# Patient Record
Sex: Female | Born: 1965 | Race: White | Hispanic: No | Marital: Married | Smoking: Never smoker
Health system: Southern US, Community
[De-identification: ages and names within clinical notes are randomized; demographics above are authoritative.]

## PROBLEM LIST (undated history)

## (undated) DIAGNOSIS — G4730 Sleep apnea, unspecified: Secondary | ICD-10-CM

## (undated) DIAGNOSIS — F329 Major depressive disorder, single episode, unspecified: Secondary | ICD-10-CM

## (undated) DIAGNOSIS — J309 Allergic rhinitis, unspecified: Secondary | ICD-10-CM

## (undated) DIAGNOSIS — F32A Depression, unspecified: Secondary | ICD-10-CM

---

## 2005-01-19 ENCOUNTER — Ambulatory Visit: Payer: Self-pay | Admitting: Family Medicine

## 2007-01-09 ENCOUNTER — Ambulatory Visit: Payer: Self-pay | Admitting: Family Medicine

## 2010-08-16 ENCOUNTER — Ambulatory Visit: Payer: Self-pay | Admitting: Family Medicine

## 2011-01-18 ENCOUNTER — Emergency Department: Payer: Self-pay | Admitting: Emergency Medicine

## 2011-08-30 ENCOUNTER — Ambulatory Visit: Payer: Self-pay | Admitting: Family Medicine

## 2012-08-30 ENCOUNTER — Ambulatory Visit: Payer: Self-pay | Admitting: Family Medicine

## 2012-08-31 ENCOUNTER — Ambulatory Visit: Payer: Self-pay | Admitting: Family Medicine

## 2013-09-17 ENCOUNTER — Ambulatory Visit: Payer: Self-pay | Admitting: Family Medicine

## 2014-12-01 ENCOUNTER — Other Ambulatory Visit: Payer: Self-pay | Admitting: Family Medicine

## 2014-12-02 ENCOUNTER — Other Ambulatory Visit: Payer: Self-pay | Admitting: Family Medicine

## 2014-12-09 ENCOUNTER — Other Ambulatory Visit: Payer: Self-pay | Admitting: Family Medicine

## 2014-12-09 DIAGNOSIS — N6082 Other benign mammary dysplasias of left breast: Secondary | ICD-10-CM

## 2014-12-23 ENCOUNTER — Ambulatory Visit
Admission: EM | Admit: 2014-12-23 | Discharge: 2014-12-23 | Disposition: A | Discharge status: Home / Self Care | Payer: BC Managed Care – PPO | Attending: Family Medicine | Admitting: Family Medicine

## 2014-12-23 DIAGNOSIS — T7840XA Allergy, unspecified, initial encounter: Secondary | ICD-10-CM

## 2014-12-23 DIAGNOSIS — Z889 Allergy status to unspecified drugs, medicaments and biological substances status: Secondary | ICD-10-CM

## 2014-12-23 DIAGNOSIS — R220 Localized swelling, mass and lump, head: Secondary | ICD-10-CM | POA: Diagnosis not present

## 2014-12-23 HISTORY — DX: Allergic rhinitis, unspecified: J30.9

## 2014-12-23 MED ORDER — EPINEPHRINE HCL 1 MG/ML IJ SOLN
0.3000 mg | Freq: Once | INTRAMUSCULAR | Status: AC
  Administered 2014-12-23: 0.3 mg via SUBCUTANEOUS

## 2014-12-23 MED ORDER — PREDNISONE 50 MG PO TABS
60.0000 mg | ORAL_TABLET | Freq: Once | ORAL | Status: AC
  Administered 2014-12-23: 60 mg via ORAL

## 2014-12-23 MED ORDER — FAMOTIDINE 20 MG PO TABS
20.0000 mg | ORAL_TABLET | Freq: Once | ORAL | Status: AC
  Administered 2014-12-23: 20 mg via ORAL

## 2014-12-23 MED ORDER — PREDNISONE 20 MG PO TABS: ORAL_TABLET | ORAL | Status: AC

## 2014-12-23 NOTE — ED Provider Notes (Signed)
CSN: [id number]     Arrival date & time 12/23/14  1910 History   First MD Initiated Contact with Patient 12/23/14 1939     Chief Complaint  Patient presents with  . Allergic Reaction   (Consider location/radiation/quality/duration/timing/severity/associated sxs/prior Treatment) HPI   Is a 49 year old female who presents with a swollen left eye. The patient has had this in the past after receiving allergy shots but this seems to be much worse than before. She states that she felt that it started to swell around 6 PM to 25 mg Benadryl's. Since that time it has decided somewhat but continues to be swollen nearly shut. Not having any foreign body sensation. He is having no respiratory distress.  Past Medical History  Diagnosis Date  . Allergic rhinitis    History reviewed. No pertinent past surgical history. No family history on file. Social History  Substance Use Topics  . Smoking status: Never Smoker   . Smokeless tobacco: None  . Alcohol Use: 0.0 oz/week    0 Standard drinks or equivalent per week     Comment: occasional   OB History    No data available     Review of Systems  Constitutional: Negative for fever, chills, activity change and fatigue.  HENT: Positive for facial swelling.   All other systems reviewed and are negative.   Allergies  Sulfa antibiotics  Home Medications   Prior to Admission medications   Medication Sig Start Date End Date Taking? Authorizing Provider  azelastine (OPTIVAR) 0.05 % ophthalmic solution 1 drop 2 (two) times daily.   Yes Historical Provider, MD  cetirizine (ZYRTEC) 10 MG tablet Take 10 mg by mouth daily.   Yes Historical Provider, MD  cholecalciferol (VITAMIN D) 1000 UNITS tablet Take 1,000 Units by mouth daily.   Yes Historical Provider, MD  citalopram (CELEXA) 20 MG tablet Take 20 mg by mouth daily.   Yes Historical Provider, MD  diphenhydrAMINE (SOMINEX) 25 MG tablet Take 25 mg by mouth at bedtime as needed for sleep.   Yes  Historical Provider, MD  fenofibrate 160 MG tablet Take 160 mg by mouth daily.   Yes Historical Provider, MD  fluticasone (FLONASE) 50 MCG/ACT nasal spray Place 2 sprays into both nostrils daily.   Yes Historical Provider, MD  Multiple Vitamins-Minerals (MULTIVITAMIN WITH MINERALS) tablet Take 1 tablet by mouth daily.   Yes Historical Provider, MD  predniSONE (DELTASONE) 20 MG tablet Take 2 tablets (40 mg) daily by mouth 12/23/14   Lorin Picket, PA-C   Meds Ordered and Administered this Visit   Medications  EPINEPHrine (ADRENALIN) injection 0.3 mg (0.3 mg Subcutaneous Given 12/23/14 1950)  predniSONE (DELTASONE) tablet 60 mg (60 mg Oral Given 12/23/14 1950)  famotidine (PEPCID) tablet 20 mg (20 mg Oral Given 12/23/14 1950)    BP 129/73 mmHg  Pulse 74  Temp(Src) 98.7 F (37.1 C) (Tympanic)  Resp 17  Ht 5\' 5"  (1.651 m)  Wt 251 lb (113.853 kg)  BMI 41.77 kg/m2  SpO2 97%  LMP 12/23/2014 No data found.   Physical Exam  Constitutional: She is oriented to person, place, and time. She appears well-developed and well-nourished. No distress.  HENT:  Head: Normocephalic and atraumatic.  Eyes: Conjunctivae and EOM are normal. Pupils are equal, round, and reactive to light. Right eye exhibits no discharge. Left eye exhibits no discharge.  Semination of the left eye shows marked periorbital edema with the lid nearly shut. Manually opening the eye shows the pupillary be reactive equal  to light and accommodation. EOMs are full and intact. Conjunctiva appears normal.  Neck: Normal range of motion. Neck supple.  Musculoskeletal: Normal range of motion. She exhibits no edema or tenderness.  Lymphadenopathy:    She has no cervical adenopathy.  Neurological: She is alert and oriented to person, place, and time.  Skin: Skin is warm and dry. She is not diaphoretic.  Psychiatric: She has a normal mood and affect. Her behavior is normal. Judgment and thought content normal.  Nursing note and vitals  reviewed.   ED Course  Procedures (including critical care time)  Labs Review Labs Reviewed - No data to display  Imaging Review No results found.   Visual Acuity Review  Right Eye Distance: 20/20 corrected Left Eye Distance: 20/25 corrected Bilateral Distance:    Right Eye Near:   Left Eye Near:    Bilateral Near:     19:50 Medication Given HM  EPINEPHrine (ADRENALIN) injection 0.3 mg - Dose: 0.3 mg ; Route: Subcutaneous ; Site: Right Arm ; Scheduled Time: 2000     19:50 Medication Given HM  predniSONE (DELTASONE) tablet 60 mg - Dose: 60 mg ; Route: Oral ; Scheduled Time: 2000     19:50 Medication Given HM  famotidine (PEPCID) tablet 20 mg - Dose: 20 mg ; Route: Oral ; Scheduled Time: 2000     19:47:07 Visual Acuity HM  Visual Acuity - R Distance: 20/20 corrected ; L Distance: 20/25 corrected         MDM   1. Allergic reaction to drug    Patient responded well to the therapy as noted above. Of leg to her that we will a call in a prescription for prednisone  was called into [facility]. SHe will begin this tomorrow. Has a recurrence tonight it is any worse she will go to emergency room. Follow up with her primary care physician.    Lorin Picket, PA-C 12/23/14 2109

## 2014-12-23 NOTE — ED Notes (Signed)
Patient has severe and significant left eye. She states that she could feel it start to feel different around 6. 2 benadryl at 615ish. She states that she has had something like this before but, not ever this significant.

## 2015-02-02 ENCOUNTER — Ambulatory Visit
Admission: RE | Admit: 2015-02-02 | Discharge: 2015-02-02 | Disposition: A | Discharge status: Home / Self Care | Payer: BC Managed Care – PPO | Source: Ambulatory Visit | Attending: Family Medicine | Admitting: Family Medicine

## 2015-02-02 ENCOUNTER — Other Ambulatory Visit: Payer: Self-pay | Admitting: Family Medicine

## 2015-02-02 DIAGNOSIS — Z87898 Personal history of other specified conditions: Secondary | ICD-10-CM | POA: Insufficient documentation

## 2015-02-02 DIAGNOSIS — N63 Unspecified lump in breast: Secondary | ICD-10-CM | POA: Diagnosis not present

## 2015-02-02 DIAGNOSIS — N6082 Other benign mammary dysplasias of left breast: Secondary | ICD-10-CM

## 2015-02-02 DIAGNOSIS — Z1231 Encounter for screening mammogram for malignant neoplasm of breast: Secondary | ICD-10-CM | POA: Diagnosis not present

## 2015-02-03 ENCOUNTER — Other Ambulatory Visit: Payer: Self-pay | Admitting: Family Medicine

## 2015-02-09 ENCOUNTER — Other Ambulatory Visit: Payer: Self-pay | Admitting: Family Medicine

## 2015-02-09 DIAGNOSIS — R928 Other abnormal and inconclusive findings on diagnostic imaging of breast: Secondary | ICD-10-CM

## 2015-02-12 ENCOUNTER — Ambulatory Visit
Admission: RE | Admit: 2015-02-12 | Discharge: 2015-02-12 | Disposition: A | Discharge status: Home / Self Care | Payer: BC Managed Care – PPO | Source: Ambulatory Visit | Attending: Family Medicine | Admitting: Family Medicine

## 2015-02-12 DIAGNOSIS — C50912 Malignant neoplasm of unspecified site of left female breast: Secondary | ICD-10-CM | POA: Insufficient documentation

## 2015-02-12 DIAGNOSIS — R928 Other abnormal and inconclusive findings on diagnostic imaging of breast: Secondary | ICD-10-CM

## 2015-02-12 HISTORY — PX: BREAST BIOPSY: SHX20

## 2015-02-16 LAB — SURGICAL PATHOLOGY

## 2016-12-09 IMAGING — US US BREAST LTD UNI LEFT INC AXILLA
1 series · 6 of 6 positions shown · non-contrast
Comparison: Previous exam(s).

CLINICAL DATA: Patient for short-term follow-up probably benign
asymmetry upper outer left breast.

EXAM:
DIGITAL DIAGNOSTIC BILATERAL MAMMOGRAM WITH 3D TOMOSYNTHESIS WITH
CAD
ULTRASOUND LEFT BREAST

[Series 1: us breast ltd uni left inc axilla · 0.08mm/px · 6 of 6 slices shown]
[im 1/6]
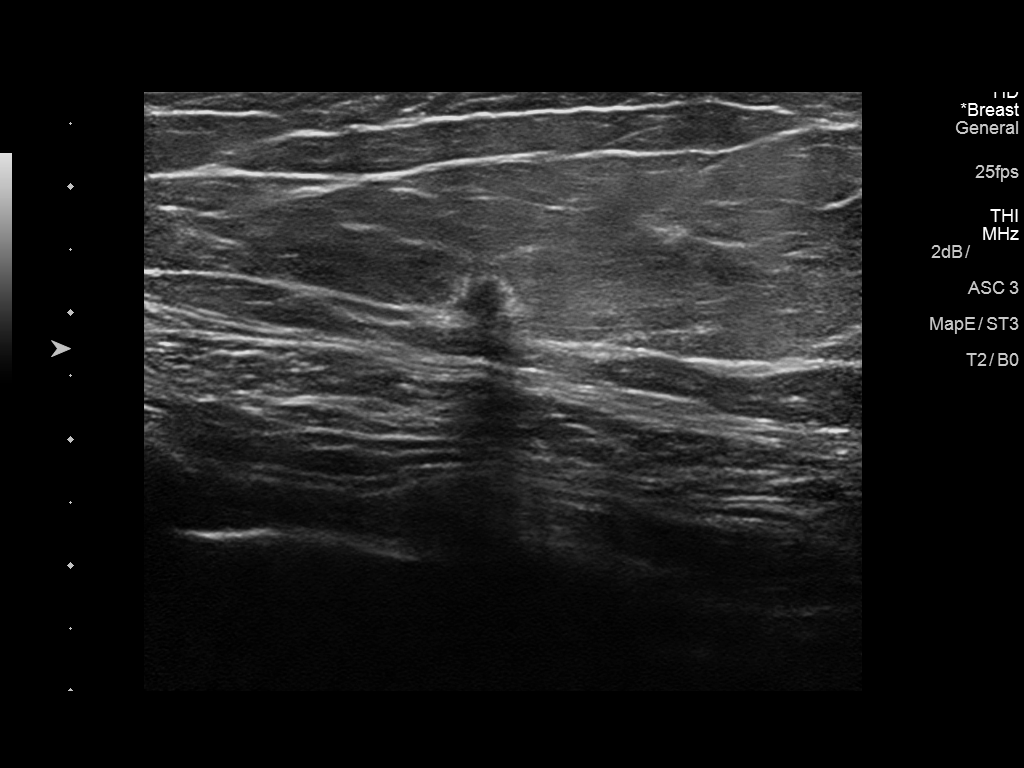
[im 2/6]
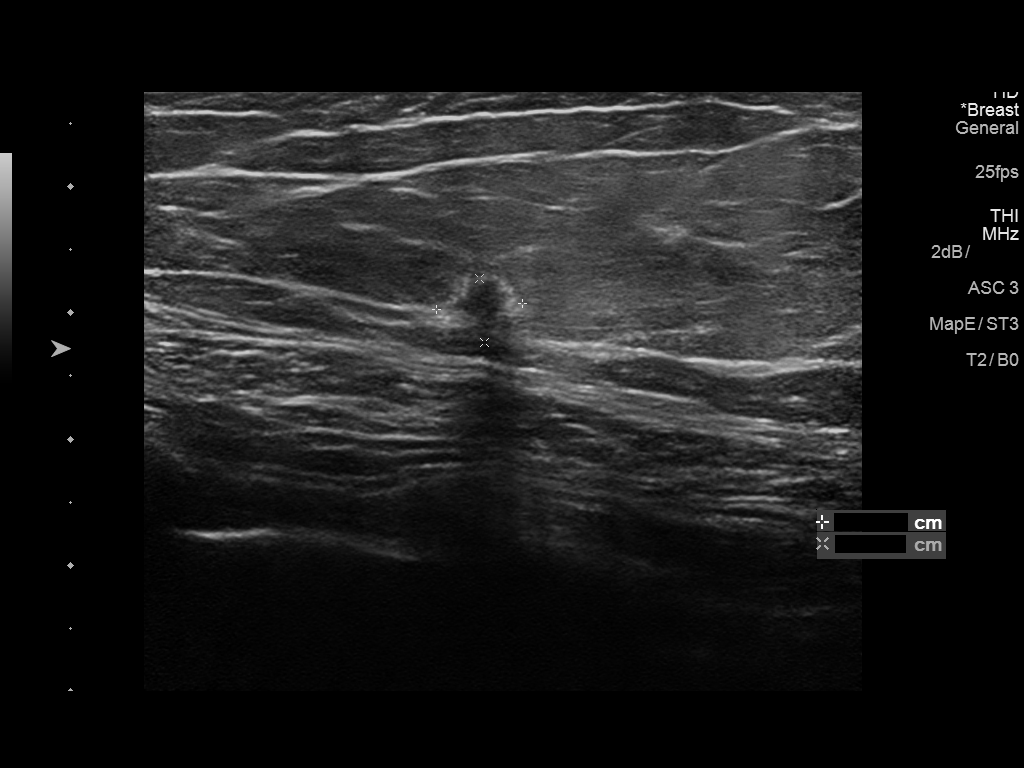
[im 3/6]
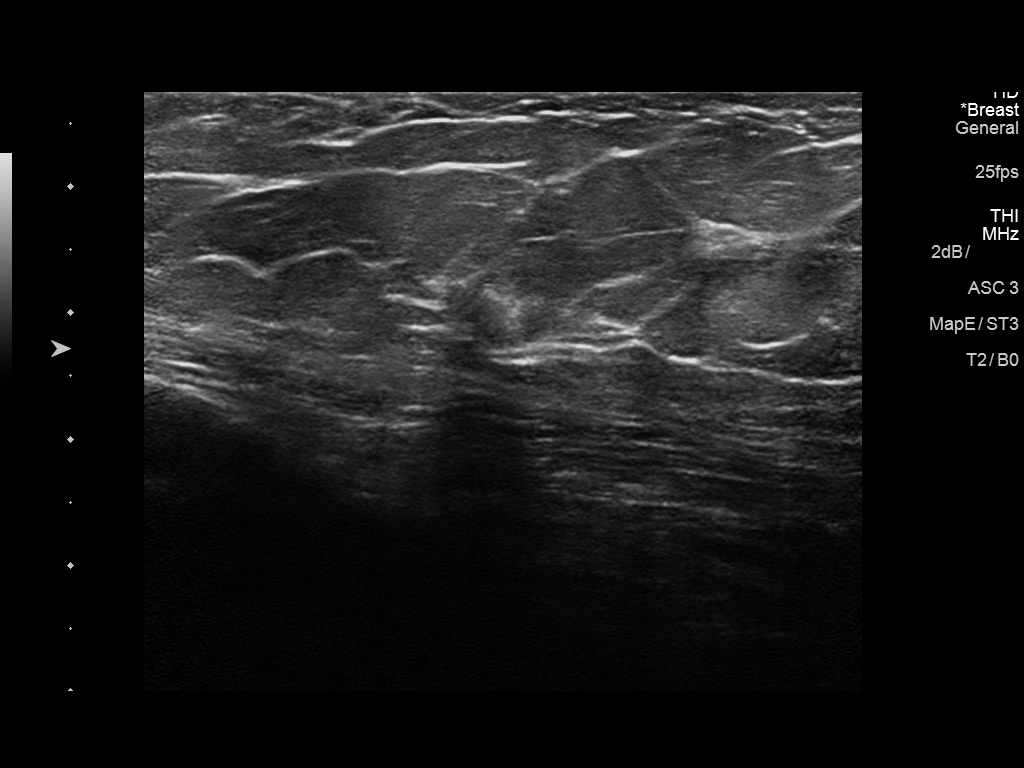
[im 4/6]
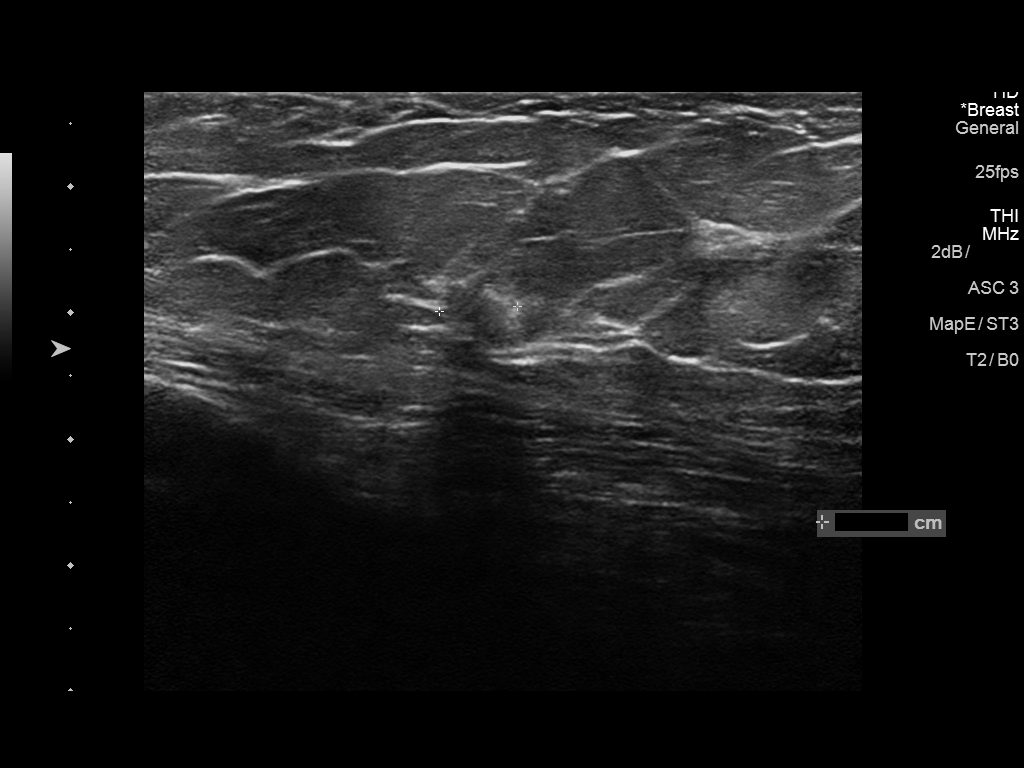
[im 5/6]
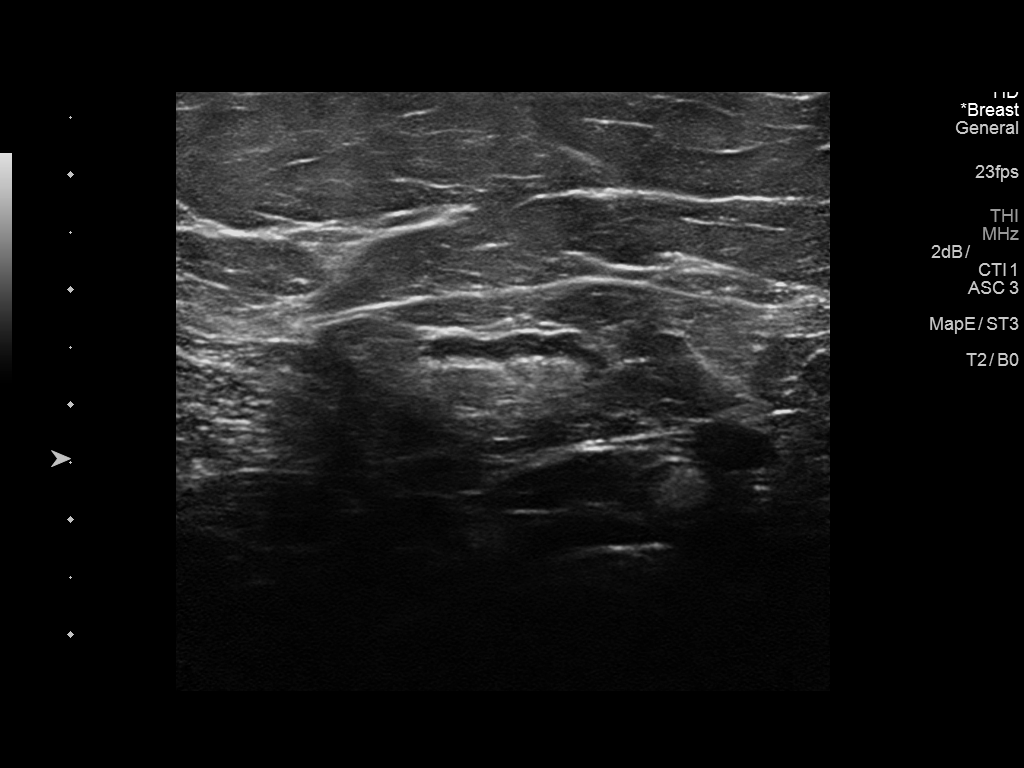
[im 6/6]
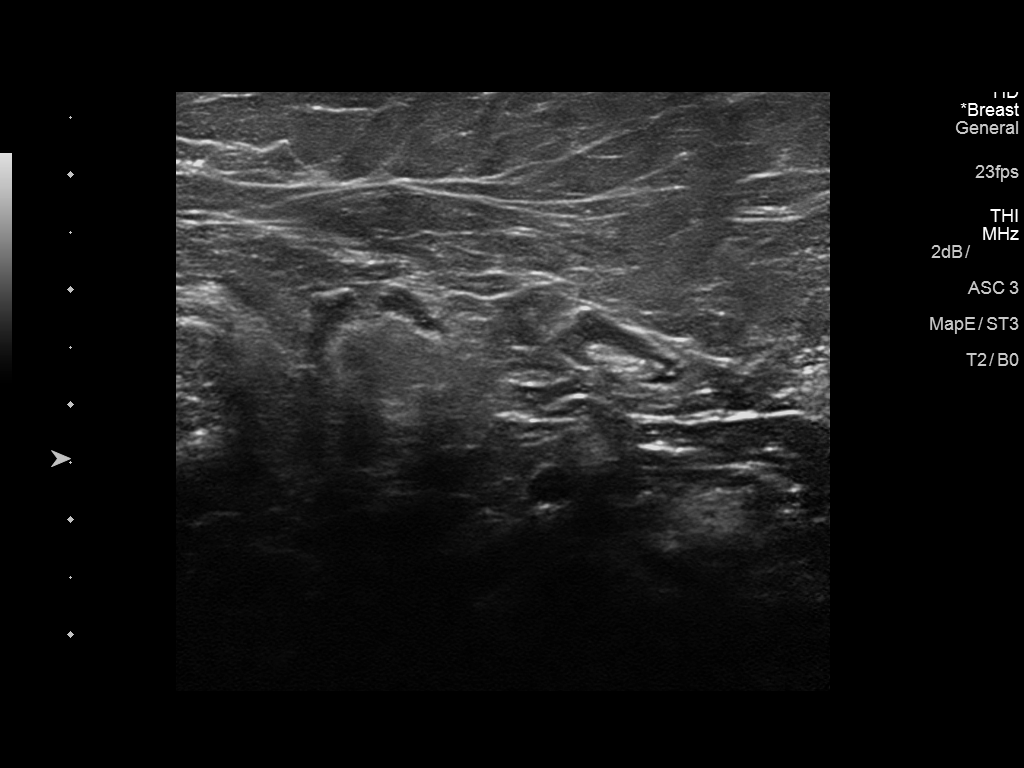

[6 of 6 positions shown; findings below may reference images not displayed]

ACR Breast Density Category c: The breast tissue is heterogeneously
dense, which may obscure small masses.
FINDINGS: Within the upper inner left breast posterior depth there is a new 9
mm irregular mass with associated architectural distortion.
Previously evaluated density within the upper-outer left breast is
stable and compatible with benign fibroglandular breast tissue. No
additional concerning masses, calcifications or architectural
distortion identified within either breast.

Mammographic images were processed with CAD.

On physical exam, I palpate no discrete mass within the upper inner
left breast.

Targeted ultrasound is performed, showing a 7 x 5 x 6 mm taller than
wide irregular hypoechoic mass within the left breast 10 o'clock
position 10 cm from the nipple. There is no left axillary
lymphadenopathy.
IMPRESSION: Suspicious left breast mass.

RECOMMENDATION:
Ultrasound-guided core needle biopsy left breast mass.

This will be scheduled at patient's convenience.

I have discussed the findings and recommendations with the patient.
Results were also provided in writing at the conclusion of the
visit. If applicable, a reminder letter will be sent to the patient
regarding the next appointment.

BI-RADS CATEGORY  4: Suspicious.

## 2017-08-08 ENCOUNTER — Encounter: Payer: Self-pay | Admitting: *Deleted

## 2017-08-09 ENCOUNTER — Encounter: Payer: Self-pay | Admitting: Anesthesiology

## 2017-08-09 ENCOUNTER — Ambulatory Visit: Payer: BC Managed Care – PPO | Admitting: Certified Registered"

## 2017-08-09 ENCOUNTER — Ambulatory Visit
Admission: RE | Admit: 2017-08-09 | Discharge: 2017-08-09 | Disposition: A | Discharge status: Home / Self Care | Payer: BC Managed Care – PPO | Source: Ambulatory Visit | Attending: Internal Medicine | Admitting: Internal Medicine

## 2017-08-09 ENCOUNTER — Encounter
Admission: RE | Disposition: A | Discharge status: Home / Self Care | Payer: Self-pay | Source: Ambulatory Visit | Attending: Internal Medicine

## 2017-08-09 DIAGNOSIS — Z800 Family history of malignant neoplasm of digestive organs: Secondary | ICD-10-CM | POA: Insufficient documentation

## 2017-08-09 DIAGNOSIS — Z6841 Body Mass Index (BMI) 40.0 and over, adult: Secondary | ICD-10-CM | POA: Insufficient documentation

## 2017-08-09 DIAGNOSIS — Z9989 Dependence on other enabling machines and devices: Secondary | ICD-10-CM | POA: Insufficient documentation

## 2017-08-09 DIAGNOSIS — G4730 Sleep apnea, unspecified: Secondary | ICD-10-CM | POA: Insufficient documentation

## 2017-08-09 DIAGNOSIS — Z86010 Personal history of colonic polyps: Secondary | ICD-10-CM | POA: Diagnosis not present

## 2017-08-09 DIAGNOSIS — Z882 Allergy status to sulfonamides status: Secondary | ICD-10-CM | POA: Insufficient documentation

## 2017-08-09 DIAGNOSIS — K640 First degree hemorrhoids: Secondary | ICD-10-CM | POA: Insufficient documentation

## 2017-08-09 DIAGNOSIS — F329 Major depressive disorder, single episode, unspecified: Secondary | ICD-10-CM | POA: Diagnosis not present

## 2017-08-09 DIAGNOSIS — Z1211 Encounter for screening for malignant neoplasm of colon: Secondary | ICD-10-CM | POA: Diagnosis present

## 2017-08-09 DIAGNOSIS — Z79899 Other long term (current) drug therapy: Secondary | ICD-10-CM | POA: Diagnosis not present

## 2017-08-09 HISTORY — DX: Sleep apnea, unspecified: G47.30

## 2017-08-09 HISTORY — PX: COLONOSCOPY WITH PROPOFOL: SHX5780

## 2017-08-09 HISTORY — DX: Major depressive disorder, single episode, unspecified: F32.9

## 2017-08-09 HISTORY — DX: Depression, unspecified: F32.A

## 2017-08-09 HISTORY — DX: Morbid (severe) obesity due to excess calories: E66.01

## 2017-08-09 SURGERY — COLONOSCOPY WITH PROPOFOL
Anesthesia: General

## 2017-08-09 MED ORDER — PROPOFOL 500 MG/50ML IV EMUL
INTRAVENOUS | Status: DC | PRN
  Administered 2017-08-09: 75 ug/kg/min via INTRAVENOUS

## 2017-08-09 MED ORDER — SODIUM CHLORIDE 0.9 % IV SOLN
INTRAVENOUS | Status: DC
  Administered 2017-08-09: 1000 mL via INTRAVENOUS

## 2017-08-09 MED ORDER — PROPOFOL 10 MG/ML IV BOLUS
INTRAVENOUS | Status: AC
  Filled 2017-08-09: qty 40

## 2017-08-09 MED ORDER — PROPOFOL 10 MG/ML IV BOLUS
INTRAVENOUS | Status: DC | PRN
  Administered 2017-08-09: 70 mg via INTRAVENOUS
  Administered 2017-08-09: 50 mg via INTRAVENOUS
  Administered 2017-08-09: 30 mg via INTRAVENOUS

## 2017-08-09 MED ORDER — LIDOCAINE HCL (PF) 1 % IJ SOLN
INTRAMUSCULAR | Status: AC
  Administered 2017-08-09: 0.3 mL
  Filled 2017-08-09: qty 2

## 2017-08-09 NOTE — Transfer of Care (Signed)
Immediate Anesthesia Transfer of Care Note  Patient: Holly Schmitt  Procedure(s) Performed: COLONOSCOPY WITH PROPOFOL (N/A )  Patient Location: PACU  Anesthesia Type:General  Level of Consciousness: awake, alert  and oriented  Airway & Oxygen Therapy: Patient Spontanous Breathing and Patient connected to nasal cannula oxygen  Post-op Assessment: Report given to RN and Post -op Vital signs reviewed and stable  Post vital signs: Reviewed and stable  Last Vitals:  Vitals Value Taken Time  BP    Temp    Pulse    Resp    SpO2      Last Pain:  Vitals:   08/09/17 1150  TempSrc: Tympanic  PainSc: 0-No pain         Complications: No apparent anesthesia complications

## 2017-08-09 NOTE — Anesthesia Preprocedure Evaluation (Addendum)
Anesthesia Evaluation  Patient identified by MRN, date of birth, ID band Patient awake    Reviewed: Allergy & Precautions, NPO status , Patient's Chart, lab work & pertinent test results  Airway Mallampati: III  TM Distance: <3 FB     Dental   Pulmonary sleep apnea ,    Pulmonary exam normal        Cardiovascular negative cardio ROS Normal cardiovascular exam     Neuro/Psych PSYCHIATRIC DISORDERS Depression    GI/Hepatic negative GI ROS, Neg liver ROS,   Endo/Other  negative endocrine ROSMorbid obesity  Renal/GU negative Renal ROS  negative genitourinary   Musculoskeletal negative musculoskeletal ROS (+)   Abdominal Normal abdominal exam  (+)   Peds negative pediatric ROS (+)  Hematology negative hematology ROS (+)   Anesthesia Other Findings   Reproductive/Obstetrics                             Anesthesia Physical Anesthesia Plan  ASA: III  Anesthesia Plan: General   Post-op Pain Management:    Induction: Intravenous  PONV Risk Score and Plan:   Airway Management Planned: Nasal Cannula  Additional Equipment:   Intra-op Plan:   Post-operative Plan:   Informed Consent: I have reviewed the patients History and Physical, chart, labs and discussed the procedure including the risks, benefits and alternatives for the proposed anesthesia with the patient or authorized representative who has indicated his/her understanding and acceptance.   Dental advisory given  Plan Discussed with: CRNA and Surgeon  Anesthesia Plan Comments:         Anesthesia Quick Evaluation

## 2017-08-09 NOTE — Anesthesia Post-op Follow-up Note (Signed)
Anesthesia QCDR form completed.        

## 2017-08-09 NOTE — Interval H&P Note (Signed)
History and Physical Interval Note:  08/09/2017 12:44 PM  Holly Schmitt  has presented today for surgery, with the diagnosis of FH polyps (father)  The various methods of treatment have been discussed with the patient and family. After consideration of risks, benefits and other options for treatment, the patient has consented to  Procedure(s): COLONOSCOPY WITH PROPOFOL (N/A) as a surgical intervention .  The patient's history has been reviewed, patient examined, no change in status, stable for surgery.  I have reviewed the patient's chart and labs.  Questions were answered to the patient's satisfaction.     [place], [name]

## 2017-08-09 NOTE — Op Note (Signed)
[facility] Patient Name: Holly Schmitt Procedure Date: 08/09/2017 12:46 PM MRN: [id number] Account #: [id number] Date of Birth: 1965-08-25 Admit Type: Outpatient Age: 52 Room: [facility] ENDO ROOM 4 Gender: Female Note Status: Finalized Procedure:            Colonoscopy Indications:          Screening in patient at increased risk: Family history                        of 1st-degree relative with colorectal cancer Providers:            Benay Pike. Alice Reichert MD, MD Referring MD:         [name] MD, MD (Referring MD) Medicines:            Propofol per Anesthesia Complications:        No immediate complications. Procedure:            Pre-Anesthesia Assessment:                       - The risks and benefits of the procedure and the                        sedation options and risks were discussed with the                        patient. All questions were answered and informed                        consent was obtained.                       - Patient identification and proposed procedure were                        verified prior to the procedure by the nurse. The                        procedure was verified in the procedure room.                       - ASA Grade Assessment: II - A patient with mild                        systemic disease.                       - After reviewing the risks and benefits, the patient                        was deemed in satisfactory condition to undergo the                        procedure.                       After obtaining informed consent, the colonoscope was                        passed under direct vision. Throughout the procedure,  the patient's blood pressure, pulse, and oxygen                        saturations were monitored continuously. The                        Colonoscope was introduced through the anus and                        advanced to the the cecum, identified by  appendiceal                        orifice and ileocecal valve. The colonoscopy was                        somewhat difficult due to restricted mobility of the                        colon. Successful completion of the procedure was aided                        by changing the patient's position and applying                        abdominal pressure. The patient tolerated the procedure                        well. The quality of the bowel preparation was good.                        The ileocecal valve, appendiceal orifice, and rectum                        were photographed. Findings:      The perianal and digital rectal examinations were normal. Pertinent       negatives include normal sphincter tone and no palpable rectal lesions.      The colon (entire examined portion) appeared normal.      Non-bleeding internal hemorrhoids were found during retroflexion. The       hemorrhoids were Grade I (internal hemorrhoids that do not prolapse).      The exam was otherwise without abnormality. Impression:           - The entire examined colon is normal.                       - Non-bleeding internal hemorrhoids.                       - The examination was otherwise normal.                       - No specimens collected. Recommendation:       - Patient has a contact number available for                        emergencies. The signs and symptoms of potential                        delayed complications were discussed with the patient.  Return to normal activities tomorrow. Written discharge                        instructions were provided to the patient.                       - Resume previous diet.                       - Continue present medications.                       - Repeat colonoscopy in 5 years for screening purposes.                       - Return to GI clinic PRN.                       - The findings and recommendations were discussed with                         the patient and their spouse. Procedure Code(s):    --- Professional ---                       I1443, Colorectal cancer screening; colonoscopy on                        individual at high risk Diagnosis Code(s):    --- Professional ---                       K64.0, First degree hemorrhoids                       Z80.0, Family history of malignant neoplasm of                        digestive organs CPT copyright 2017 American Medical Association. All rights reserved. The codes documented in this report are preliminary and upon coder review may  be revised to meet current compliance requirements. Efrain Sella MD, MD 08/09/2017 1:13:51 PM This report has been signed electronically. Number of Addenda: 0 Note Initiated On: 08/09/2017 12:46 PM Scope Withdrawal Time: 0 hours 9 minutes 6 seconds  Total Procedure Duration: 0 hours 20 minutes 30 seconds       [facility]

## 2017-08-09 NOTE — H&P (Signed)
Outpatient short stay form Pre-procedure 08/09/2017 11:34 AM Holly Schmitt Holly Schmitt, M.D.  Primary Physician: [name], M.D.  Reason for visit:  Colon polyp surveillance  History of present illness:  Patient with personal hx of polyps presents for surveillance. Patient denies change in bowel habits, rectal bleeding, weight loss or abdominal pain.    No current facility-administered medications for this encounter.   Current Outpatient Medications:  .  azelastine (ASTELIN) 0.1 % nasal spray, Place 2 sprays into both nostrils daily. Use in each nostril as directed, Disp: , Rfl:  .  EPINEPHrine (EPIPEN 2-PAK) 0.3 mg/0.3 mL IJ SOAJ injection, Inject into the muscle once., Disp: , Rfl:  .  GLUCOSAMINE SULFATE PO, Take by mouth., Disp: , Rfl:  .  levocetirizine (XYZAL) 5 MG tablet, Take 5 mg by mouth daily., Disp: , Rfl:  .  montelukast (SINGULAIR) 10 MG tablet, Take 10 mg by mouth., Disp: , Rfl:  .  Omega-3 Fatty Acids (FISH OIL OMEGA-3 PO), Take 2 capsules by mouth daily., Disp: , Rfl:  .  tamoxifen (NOLVADEX) 20 MG tablet, Take 20 mg by mouth daily., Disp: , Rfl:  .  UNABLE TO FIND, Allergy shots with allergist, Disp: , Rfl:  .  azelastine (OPTIVAR) 0.05 % ophthalmic solution, 1 drop 2 (two) times daily., Disp: , Rfl:  .  cetirizine (ZYRTEC) 10 MG tablet, Take 10 mg by mouth daily., Disp: , Rfl:  .  cholecalciferol (VITAMIN D) 1000 UNITS tablet, Take 1,000 Units by mouth daily., Disp: , Rfl:  .  citalopram (CELEXA) 20 MG tablet, Take 20 mg by mouth daily., Disp: , Rfl:  .  diphenhydrAMINE (SOMINEX) 25 MG tablet, Take 25 mg by mouth at bedtime as needed for sleep., Disp: , Rfl:  .  fenofibrate 160 MG tablet, Take 160 mg by mouth daily., Disp: , Rfl:  .  fluticasone (FLONASE) 50 MCG/ACT nasal spray, Place 2 sprays into both nostrils daily., Disp: , Rfl:  .  Multiple Vitamins-Minerals (MULTIVITAMIN WITH MINERALS) tablet, Take 1 tablet by mouth daily., Disp: , Rfl:  .  predniSONE (DELTASONE)  20 MG tablet, Take 2 tablets (40 mg) daily by mouth, Disp: 8 tablet, Rfl: 0  No medications prior to admission.     Allergies  Allergen Reactions  . Sulfa Antibiotics Rash     Past Medical History:  Diagnosis Date  . Allergic rhinitis   . Depression   . Morbid obesity ([place])   . Sleep apnea    CPAP    Review of systems:  Otherwise negative.    Physical Exam  Gen: Alert, oriented. Appears stated age.  HEENT: [place]/AT. PERRLA. Lungs: CTA, no wheezes. CV: RR nl S1, S2. Abd: soft, benign, no masses. BS+ Ext: No edema. Pulses 2+    Planned procedures: Proceed with colonoscopy. The patient understands the nature of the planned procedure, indications, risks, alternatives and potential complications including but not limited to bleeding, infection, perforation, damage to internal organs and possible oversedation/side effects from anesthesia. The patient agrees and gives consent to proceed.  Please refer to procedure notes for findings, recommendations and patient disposition/instructions.     Holly Schmitt Holly Schmitt, M.D. Gastroenterology 08/09/2017  11:34 AM

## 2017-08-10 ENCOUNTER — Encounter: Payer: Self-pay | Admitting: Internal Medicine

## 2017-08-10 LAB — POCT PREGNANCY, URINE: Preg Test, Ur: NEGATIVE

## 2017-08-10 NOTE — Anesthesia Postprocedure Evaluation (Signed)
Anesthesia Post Note  Patient: Holly Schmitt  Procedure(s) Performed: COLONOSCOPY WITH PROPOFOL (N/A )  Patient location during evaluation: PACU Anesthesia Type: General Level of consciousness: awake and alert and oriented Pain management: pain level controlled Vital Signs Assessment: post-procedure vital signs reviewed and stable Respiratory status: spontaneous breathing Cardiovascular status: blood pressure returned to baseline Anesthetic complications: no     Last Vitals:  Vitals:   08/09/17 1326 08/09/17 1336  BP: 104/65 (!) 96/55  Pulse: 78 70  Resp: 16 (!) 25  Temp:    SpO2: 97% 99%    Last Pain:  Vitals:   08/09/17 1326  TempSrc:   PainSc: 0-No pain                 Nahdia Doucet

## 2019-03-23 ENCOUNTER — Ambulatory Visit: Payer: BC Managed Care – PPO | Attending: Internal Medicine

## 2019-03-23 DIAGNOSIS — Z23 Encounter for immunization: Secondary | ICD-10-CM | POA: Insufficient documentation

## 2019-03-23 NOTE — Progress Notes (Signed)
   Covid-19 Vaccination Clinic  Name:  Holly Schmitt    MRN: [id number] DOB: 20-Sep-1965  03/23/2019  Holly Schmitt was observed post Covid-19 immunization for 15 minutes without incident. She was provided with Vaccine Information Sheet and instruction to access the V-Safe system.   Holly Schmitt was instructed to call 911 with any severe reactions post vaccine: Marland Kitchen Difficulty breathing  . Swelling of face and throat  . A fast heartbeat  . A bad rash all over body  . Dizziness and weakness   Immunizations Administered    Name Date Dose VIS Date Route   Moderna COVID-19 Vaccine 03/23/2019  2:01 PM 0.5 mL 12/18/2018 Intramuscular   Manufacturer: Moderna   Lot: [id number]   [id number]

## 2019-04-20 ENCOUNTER — Ambulatory Visit: Payer: BC Managed Care – PPO | Attending: Internal Medicine

## 2019-04-20 DIAGNOSIS — Z23 Encounter for immunization: Secondary | ICD-10-CM

## 2019-04-20 NOTE — Progress Notes (Signed)
   Covid-19 Vaccination Clinic  Name:  LASHONNE FLETES    MRN: [id number] DOB: [birth year]  04/20/2019  Ms. Eilertson was observed post Covid-19 immunization for 15 minutes without incident. She was provided with Vaccine Information Sheet and instruction to access the V-Safe system.   Ms. Maczka was instructed to call 911 with any severe reactions post vaccine: Marland Kitchen Difficulty breathing  . Swelling of face and throat  . A fast heartbeat  . A bad rash all over body  . Dizziness and weakness   Immunizations Administered    Name Date Dose VIS Date Route   Moderna COVID-19 Vaccine 04/20/2019  8:19 AM 0.5 mL 12/18/2018 Intramuscular   Manufacturer: Levan Hurst   [id number]   [place][id number]

## 2023-01-02 ENCOUNTER — Ambulatory Visit: Payer: BC Managed Care – PPO

## 2023-01-02 DIAGNOSIS — Z83719 Family history of colon polyps, unspecified: Secondary | ICD-10-CM

## 2023-01-02 DIAGNOSIS — Z1211 Encounter for screening for malignant neoplasm of colon: Secondary | ICD-10-CM

## 2023-01-02 DIAGNOSIS — K640 First degree hemorrhoids: Secondary | ICD-10-CM

## 2023-01-02 DIAGNOSIS — K5730 Diverticulosis of large intestine without perforation or abscess without bleeding: Secondary | ICD-10-CM | POA: Diagnosis not present

## 2023-08-25 ENCOUNTER — Other Ambulatory Visit: Payer: Self-pay | Admitting: Neurology

## 2023-08-25 DIAGNOSIS — R4189 Other symptoms and signs involving cognitive functions and awareness: Secondary | ICD-10-CM

## 2023-08-25 DIAGNOSIS — R413 Other amnesia: Secondary | ICD-10-CM

## 2023-08-30 ENCOUNTER — Ambulatory Visit
Admission: RE | Admit: 2023-08-30 | Discharge: 2023-08-30 | Disposition: A | Discharge status: Home / Self Care | Source: Ambulatory Visit | Attending: Neurology | Admitting: Neurology

## 2023-08-30 DIAGNOSIS — R413 Other amnesia: Secondary | ICD-10-CM | POA: Insufficient documentation

## 2023-08-30 DIAGNOSIS — R4189 Other symptoms and signs involving cognitive functions and awareness: Secondary | ICD-10-CM | POA: Insufficient documentation
